# Patient Record
Sex: Female | Born: 1970 | Race: White | Hispanic: No | Marital: Married | Smoking: Never smoker
Health system: Southern US, Community
[De-identification: ages and names within clinical notes are randomized; demographics above are authoritative.]

## PROBLEM LIST (undated history)

## (undated) DIAGNOSIS — F419 Anxiety disorder, unspecified: Secondary | ICD-10-CM

## (undated) DIAGNOSIS — F32A Depression, unspecified: Secondary | ICD-10-CM

## (undated) DIAGNOSIS — K589 Irritable bowel syndrome without diarrhea: Secondary | ICD-10-CM

## (undated) DIAGNOSIS — F329 Major depressive disorder, single episode, unspecified: Secondary | ICD-10-CM

## (undated) HISTORY — DX: Irritable bowel syndrome, unspecified: K58.9

## (undated) HISTORY — PX: WISDOM TOOTH EXTRACTION: SHX21

## (undated) HISTORY — DX: Depression, unspecified: F32.A

## (undated) HISTORY — DX: Anxiety disorder, unspecified: F41.9

## (undated) HISTORY — DX: Major depressive disorder, single episode, unspecified: F32.9

---

## 2002-11-23 ENCOUNTER — Ambulatory Visit (HOSPITAL_COMMUNITY): Admission: RE | Admit: 2002-11-23 | Discharge: 2002-11-23 | Payer: Self-pay | Admitting: Obstetrics and Gynecology

## 2002-11-23 ENCOUNTER — Encounter: Payer: Self-pay | Admitting: Obstetrics and Gynecology

## 2003-02-09 ENCOUNTER — Ambulatory Visit (HOSPITAL_COMMUNITY): Admission: RE | Admit: 2003-02-09 | Discharge: 2003-02-09 | Payer: Self-pay | Admitting: Obstetrics and Gynecology

## 2003-02-09 ENCOUNTER — Encounter: Payer: Self-pay | Admitting: Obstetrics and Gynecology

## 2003-04-17 ENCOUNTER — Inpatient Hospital Stay (HOSPITAL_COMMUNITY): Admission: AD | Admit: 2003-04-17 | Discharge: 2003-04-20 | Payer: Self-pay | Admitting: Obstetrics and Gynecology

## 2004-08-29 ENCOUNTER — Other Ambulatory Visit: Admission: RE | Admit: 2004-08-29 | Discharge: 2004-08-29 | Payer: Self-pay | Admitting: Obstetrics and Gynecology

## 2005-02-06 ENCOUNTER — Inpatient Hospital Stay (HOSPITAL_COMMUNITY): Admission: RE | Admit: 2005-02-06 | Discharge: 2005-02-09 | Payer: Self-pay | Admitting: Obstetrics and Gynecology

## 2005-09-15 ENCOUNTER — Other Ambulatory Visit: Admission: RE | Admit: 2005-09-15 | Discharge: 2005-09-15 | Payer: Self-pay | Admitting: Obstetrics and Gynecology

## 2006-05-04 ENCOUNTER — Ambulatory Visit: Payer: Self-pay | Admitting: Internal Medicine

## 2006-05-20 ENCOUNTER — Ambulatory Visit: Payer: Self-pay | Admitting: Family Medicine

## 2006-06-17 ENCOUNTER — Ambulatory Visit: Payer: Self-pay | Admitting: Family Medicine

## 2006-06-17 LAB — CONVERTED CEMR LAB
Basophils Absolute: 0 10*3/uL (ref 0.0–0.1)
HDL: 66.4 mg/dL (ref >39.0)
Lymphocytes Relative: 41.6 % (ref 12.0–46.0)
MCV: 96.8 fL (ref 78.0–100.0)
Monocytes Relative: 10.6 % (ref 3.0–11.0)
Neutro Abs: 2.4 10*3/uL (ref 1.4–7.7)
Platelets: 366 10*3/uL (ref 150–400)
TSH: 1.59 microintl units/mL (ref 0.35–5.50)
Triglyceride fasting, serum: 85 mg/dL (ref 0–149)
VLDL: 17 mg/dL (ref 0–40)

## 2007-09-08 ENCOUNTER — Ambulatory Visit: Payer: Self-pay | Admitting: Family Medicine

## 2007-09-08 DIAGNOSIS — J019 Acute sinusitis, unspecified: Secondary | ICD-10-CM

## 2007-09-26 ENCOUNTER — Telehealth (INDEPENDENT_AMBULATORY_CARE_PROVIDER_SITE_OTHER): Payer: Self-pay | Admitting: *Deleted

## 2007-09-29 ENCOUNTER — Encounter (INDEPENDENT_AMBULATORY_CARE_PROVIDER_SITE_OTHER): Payer: Self-pay | Admitting: *Deleted

## 2007-10-05 ENCOUNTER — Telehealth (INDEPENDENT_AMBULATORY_CARE_PROVIDER_SITE_OTHER): Payer: Self-pay | Admitting: Family Medicine

## 2007-11-01 ENCOUNTER — Telehealth (INDEPENDENT_AMBULATORY_CARE_PROVIDER_SITE_OTHER): Payer: Self-pay | Admitting: *Deleted

## 2008-09-19 ENCOUNTER — Ambulatory Visit: Payer: Self-pay | Admitting: Family Medicine

## 2008-09-19 DIAGNOSIS — L719 Rosacea, unspecified: Secondary | ICD-10-CM | POA: Insufficient documentation

## 2009-08-19 ENCOUNTER — Ambulatory Visit: Payer: Self-pay | Admitting: Family

## 2009-08-19 DIAGNOSIS — H1030 Unspecified acute conjunctivitis, unspecified eye: Secondary | ICD-10-CM | POA: Insufficient documentation

## 2010-09-07 ENCOUNTER — Encounter: Payer: Self-pay | Admitting: Obstetrics and Gynecology

## 2010-09-07 ENCOUNTER — Encounter: Payer: Self-pay | Admitting: Family Medicine

## 2010-09-18 NOTE — Assessment & Plan Note (Signed)
Summary: infection in both eyes/kdc   Vital Signs:  Patient profile:   40 year old female Weight:      130.2 pounds Temp:     98.5 degrees F oral BP sitting:   110 / 60  (left arm)  Vitals Entered By: Doristine Devoid (August 19, 2009 11:24 AM) CC: both eyes itching and redness  Flu Vaccine Consent Questions     Do you have a history of severe allergic reactions to this vaccine? no    Any prior history of allergic reactions to egg and/or gelatin? no    Do you have a sensitivity to the preservative Thimersol? no    Do you have a past history of Guillan-Barre Syndrome? no    Do you currently have an acute febrile illness? no    Have you ever had a severe reaction to latex? no    Vaccine information given and explained to patient? yes    Are you currently pregnant? no    Lot Number:[id number]   Exp Date:02/13/2010   Site Given  Right Deltoid IM   CC:  both eyes itching and redness .  History of Present Illness: Ms Sonnenfeld is a 40 year old female who presents today with bilateral eye redness.  Started on saturday in the left eye, then progressed to include the right eye as well.    Notes + sick contacts- child had H1N1 recently.    Allergies: 1)  Codeine  Review of Systems       Notes + itching, green "goop" in both eyes in AM "sealed shut" in AM .  + clear nasal congestion, no cough,  low grade temp 99.5.  Denies visual changes  Physical Exam  General:  Well-developed,well-nourished,in no acute distress; alert,appropriate and cooperative throughout examination Head:  Normocephalic and atraumatic without obvious abnormalities. No apparent alopecia or balding. Eyes:  bilateral scleral injection Ears:  External ear exam shows no significant lesions or deformities.  Otoscopic examination reveals clear canals, tympanic membranes are intact bilaterally without bulging, retraction, inflammation or discharge. Hearing is grossly normal bilaterally. Lungs:  Normal respiratory effort,  chest expands symmetrically. Lungs are clear to auscultation, no crackles or wheezes. Heart:  Normal rate and regular rhythm. S1 and S2 normal without gallop, murmur, click, rub or other extra sounds.   Impression & Recommendations:  Problem # 1:  CONJUNCTIVITIS, ACUTE, BILATERAL (ICD-372.00) Assessment New Advised patient on good hand washing.  Patient given letter for work.  Her updated medication list for this problem includes:    Ciloxan 0.3 % Soln (Ciprofloxacin hcl) .Marland Kitchen... 2 drops in each eye every 2 hours while awake x 2 days then every 4 hours x 5 days.  Complete Medication List: 1)  Ciloxan 0.3 % Soln (Ciprofloxacin hcl) .... 2 drops in each eye every 2 hours while awake x 2 days then every 4 hours x 5 days.  Other Orders: Admin 1st Vaccine (57846) Flu Vaccine 25yrs + (96295)  Patient Instructions: 1)  call if your symptoms worsen or do not improve.  Go to ER if you develop visual problems.  Prescriptions: CILOXAN 0.3 % SOLN (CIPROFLOXACIN HCL) 2 drops in each eye every 2 hours while awake x 2 days then every 4 hours x 5 days.  #1 x 1   Entered and Authorized by:   Lemont Fillers FNP   Signed by:   Lemont Fillers FNP on 08/19/2009   Method used:   Electronically to  [facility] [address] [id number] (retail)       [address]       [place], [place]  [address]       Ph: [phone]       Fax: [phone]   RxID:   [id number]

## 2010-09-18 NOTE — Letter (Signed)
Summary: Out of Work  [facility] [employer]  [address] [place], [place] [address]   Phone: [phone]  Fax: [phone]    August 19, 2009   Employee:  CRISTIE MCKINNEY    To Whom It May Concern:   For Medical reasons, please excuse the above named employee from work for the following dates:  Start:   1/3  End:   may return 1/5  If you need additional information, please feel free to contact our office.         Sincerely,    Lemont Fillers FNP

## 2010-12-29 ENCOUNTER — Other Ambulatory Visit: Payer: Self-pay | Admitting: Obstetrics and Gynecology

## 2010-12-29 DIAGNOSIS — N6310 Unspecified lump in the right breast, unspecified quadrant: Secondary | ICD-10-CM

## 2011-01-02 ENCOUNTER — Ambulatory Visit
Admission: RE | Admit: 2011-01-02 | Discharge: 2011-01-02 | Disposition: A | Discharge status: Home / Self Care | Payer: BC Managed Care – PPO | Source: Ambulatory Visit | Attending: Obstetrics and Gynecology | Admitting: Obstetrics and Gynecology

## 2011-01-02 DIAGNOSIS — N6310 Unspecified lump in the right breast, unspecified quadrant: Secondary | ICD-10-CM

## 2011-01-02 NOTE — H&P (Signed)
NAMEAGNES, BRIGHTBILL NO.:  [id number]   MEDICAL RECORD NO.:  [id number]          PATIENT TYPE:  INP   LOCATION:  NA                            [facility]   PHYSICIAN:  Huel Cote, M.D. DATE OF BIRTH:  04/28/1971   DATE OF ADMISSION:  DATE OF DISCHARGE:                                HISTORY & PHYSICAL   HISTORY OF PRESENT ILLNESS:  Ms. Willig is a 40 year old G3, P1-0-1-1, who  is being admitted for a scheduled cesarean section at 46 weeks' gestation  for a breech presentation and oligohydramnios.  The patient's due date is  February 26, 2005, by her period consistent with a first trimester ultrasound at  nine weeks.  She was found to have an amniotic fluid level of 6.4 on June 19  and was followed up three days later after rest and hydration with a maximum  fluid level noted to be 5.5-6.  The baby's position was a breech position  and given the persistently low fluid and breech presentation, decision was  made to proceed with cesarean section after careful counseling.  Prenatal  care has been otherwise uneventful.   LABORATORY DATA:  Prenatal labs are as follows:  A positive, antibody  negative.  RPR nonreactive.  Rubella immune.  Hepatitis B surface antigen  negative.  HIV declined.  GC negative, Chlamydia negative.  Group B Strep  negative.  Triple screen normal.   PAST OBSTETRICAL HISTORY:  Significant for one vaginal delivery of a 6 pound  3 ounce infant at 39 weeks in September of 2004.  In 2005 she had one  spontaneous miscarriage.   PAST GYN HISTORY:  Unremarkable.   PAST MEDICAL HISTORY:  Significant for irritable bowel syndrome.   PAST SURGICAL HISTORY:  None.   HABITS:  She was a smoker; however, she quit with pregnancy.   PHYSICAL EXAMINATION:  VITAL SIGNS:  The patient's weight is 144 pounds,  blood pressure 100/70.  CARDIAC:  Regular rate and rhythm.  LUNGS:  Clear.  ABDOMEN:  Gravid, soft, and nontender.  CERVIX:  Cervix was 50/1  and breech presentation noted.   The patient was counseled to the risks and benefits of proceeding with  cesarean section including bleeding, infection, possible damage to bowel and  bladder.  She understands these risks and desires to proceed with the C-  section as stated.  We also discussed the possibility of some premature  symptoms in a 37-week infant; however, given her persistent AFI at around 5,  it was felt that given she is term status now, that it is best to proceed  with cesarean section.       KR/MEDQ  D:  02/05/2005  T:  02/05/2005  Job:  [id number]

## 2011-01-02 NOTE — Op Note (Signed)
Joyce Jackson, Joyce Jackson                ACCOUNT NO.:  [id number]   MEDICAL RECORD NO.:  [id number]          PATIENT TYPE:  INP   LOCATION:  9198                          [facility]   PHYSICIAN:  Huel Cote, M.D. DATE OF BIRTH:  03/25/1971   DATE OF PROCEDURE:  02/06/2005  DATE OF DISCHARGE:                                 OPERATIVE REPORT   PREOPERATIVE DIAGNOSES:  1.  Term pregnancy at 37 weeks.  2.  Breech presentation.  3.  Oligohydramnios.   POSTOPERATIVE DIAGNOSES:  1.  Term pregnancy at 37 weeks.  2.  Breech presentation.  3.  Oligohydramnios.   PROCEDURE:  Primary low transverse cesarean section.   SURGEON:  Dr. Huel Cote.   ASSISTANT:  Dr. Tracey Harries.   ANESTHESIA:  Spinal.   FLUIDS:  Estimated blood loss 800 mL, urine output 400 mL.   FINDINGS:  A vigorous female infant in the frank breech presentation.  Apgars were 9 and 9, the weight was 5 pounds 14 ounces.  There were normal  uterus, tubes and ovaries noted bilaterally.   OPERATIVE NOTE:  The patient was taken to the operating room, where spinal  anesthesia was obtained without difficulty.  She was then prepped and draped in the normal sterile fashion in the dorsal  supine position with a leftward tilt.  A Pfannenstiel skin incision was then  made and carried through to the underlying layer of fascia with sharp  dissection and Bovie cautery.  The fascia was then nicked in the midline.  The incision was extended laterally with Mayo scissors.  The inferior aspect  was then grasped Kocher clamps, elevated and dissected off the underlying  rectus muscles, superior was elevated and dissected off the rectus muscles.  The rectus muscles were separated in the midline and the peritoneal incision  was opened sharply.  Peritoneum was then extended both superiorly and  inferiorly with careful attention to avoid both bowel and bladder.  The  bladder blade was then inserted and the vesicouterine peritoneum  identified  and a bladder flap created with Metzenbaum scissors.  The uterus was then  incised in a transverse fashion and the cavity itself entered bluntly.  There was a small amount of clear fluid noted.  The bandage scissors were  then used to further open the uterine incision.  The infant was then  delivered from a breech presentation with arms and legs carefully reduced  and the head delivered in a flexed position without difficulty.  Nuchal cord  was reduced after delivery of head.  The infant was bulb-suctioned, the cord  clamped and the infant handed to the waiting pediatricians.  The cord blood  was obtained and the placenta delivered manually.  The uterus was cleared of  all clots and debris with a moist lap sponge.  The uterine incision was then  closed with 0 chromic in two layers, first a running locked layer and the  second an imbricating layer.  The incision then appeared hemostatic and the  rectus muscles were then closed with several interrupted sutures of 0  Vicryl.  The  fascia was closed with 0 Vicryl in a running fashion and the  skin was closed with staples.  The sponge, lap and needle counts were  correct x2 and the patient was taken to the recovery room in stable  condition.  Baby went to the newborn nursery.       KR/MEDQ  D:  02/06/2005  T:  02/06/2005  Job:  [id number]

## 2011-01-02 NOTE — Discharge Summary (Signed)
Joyce Jackson, Joyce Jackson                ACCOUNT NO.:  [id number]   MEDICAL RECORD NO.:  [id number]          PATIENT TYPE:  INP   LOCATION:  9102                          [facility]   PHYSICIAN:  Huel Cote, M.D. DATE OF BIRTH:  July 21, 1971   DATE OF ADMISSION:  02/06/2005  DATE OF DISCHARGE:  02/09/2005                                 DISCHARGE SUMMARY   DISCHARGE DIAGNOSES:  1.  Term pregnancy at 37 weeks, delivered.  2.  Breech presentation.  3.  Oligohydramnios.  4.  Status post primary low transverse cesarean section.   DISCHARGE MEDICATIONS:  1.  Motrin 600 mg p.o. q.6h.  2.  Percocet one to two tablets p.o. q.4h. p.r.n.   DISCHARGE FOLLOWUP:  The patient is to follow up in 2 weeks for an incision  check.   HOSPITAL COURSE:  The patient is a 40 year old G2 P1 who is admitted for a  scheduled cesarean section at 37+ weeks for a breech presentation and  oligohydramnios. The patient's fluid had been followed closely over the last  2 weeks of pregnancy and had drifted down to approximately 5 AFI. The baby  had a breech presentation and persistently low fluid which made a version  attempt impossible. Other prenatal care had been uneventful. For entire  medical history please see the previously dictated H&P. On the day of  surgery the patient underwent a primary low transverse cesarean section  without difficulty. She was delivered of a vigorous female infant in the  frank breech presentation. Apgars were 9 and 9, weight was 5 pounds 14  ounces. She was noted to have normal uterus, ovaries and tubes. She was then  admitted for routine postpartum and postoperative care and did very well. On  postoperative day #1 she was afebrile with stable vital signs and was doing  quite well ambulating. The baby did have a brief stay in the NICU for some  transient tachypnea and low blood sugar; however, was able to be discharged  home at the normal time. The patient also did quite well.  Her postoperative  hemoglobin was 10.5, down from 13.1. By postoperative day #3 she was  tolerating regular diet, ambulating, and her pain was well controlled. Her  incision was clear and staples were out, Steri-Strips placed. She was then  felt stable for discharge home and as baby was able to be discharged, was  discharged home with follow-up in the office as previously stated.      Huel Cote, M.D.  Electronically Signed     KR/MEDQ  D:  03/31/2005  T:  03/31/2005  Job:  [id number]

## 2011-01-02 NOTE — Discharge Summary (Signed)
NAME:  Joyce Jackson, Joyce Jackson                          ACCOUNT NO.:  [id number]   MEDICAL RECORD NO.:  [id number]                   PATIENT TYPE:  INP   LOCATION:  9103                                 [facility]   PHYSICIAN:  Huel Cote, M.D.              DATE OF BIRTH:  [birth year]   DATE OF ADMISSION:  04/17/2003  DATE OF DISCHARGE:  04/20/2003                                 DISCHARGE SUMMARY   DISCHARGE DIAGNOSES:  1. Term pregnancy at 39-weeks delivered.  2. Status post normal spontaneous vaginal delivery.   DISCHARGE MEDICATIONS:  1. Motrin 600 mg p.o. every six hours.  2. Percocet 1-2 tablets p.o. every four hours p.r.n.   DISCHARGE FOLLOWUP:  Patient is to followup in the office for her routine  visit in six weeks.   HOSPITAL COURSE:  Patient is a 40 year old G1, P0 who was admitted at 39-0/7  weeks with a complaint of spontaneous rupture of membranes.  She only had  mild irregular contractions on presentation.  Prenatal care was  uncomplicated.  Prenatal labs were as follows:  A positive, antibody  negative, RPR nonreactive, rubella immune, hepatitis B surface antigen  negative, HIV negative, GC negative, Chlamydia negative, GBS negative.   PAST OB HISTORY:  None.   PAST GYN HISTORY:  No abnormal Pap smears.   PAST MEDICAL HISTORY:  1. History of irritable bowel syndrome.  2. History of Raynaud disease.   PAST SURGICAL HISTORY:  None.   ALLERGIES:  None.   PHYSICAL EXAMINATION:  VITAL SIGNS:  Afebrile with stable vital signs.  Fetal heart rate was reactive.  PELVIC:  Only occasional irregular contractions were noted.  The patient was  gravid and nontender.  Estimated fetal weight was 7 and 1/2 pounds.  Cervix  was 90%, 3-cm, and -2 station with a bulging fore bag, positive nitrazine,  positive pool.   She was admitted for delivery.  The patient decided to walk primarily for  several hours and progressed on her own to 5-cm.  At that point she received  an  epidural.  She continued to progress throughout the night and reached  complete dilation and pushed well with a normal spontaneous vaginal delivery  of a vigorous female infant over a first-degree vaginal laceration.  Apgar's  were 8 and 9.  Weight was 6 pounds 3 ounces.  Placenta was expressed  spontaneously.  Estimated blood loss was 300 cc.  Small first-degree  laceration was repaired with 2-0 Vicryl for hemostasis.   By postpartum day number two, she was doing very well.  Had no complaints.  Her pain was well controlled and she was felt stable for discharge home.  The fundus was firm.  She was afebrile and she was discharged home with  Motrin and Percocet prescriptions.  Huel Cote, M.D.    KR/MEDQ  D:  04/20/2003  T:  04/21/2003  Job:  [id number]

## 2012-05-07 LAB — HM PAP SMEAR: HM PAP: NORMAL

## 2012-10-14 ENCOUNTER — Other Ambulatory Visit: Payer: Self-pay

## 2012-10-14 DIAGNOSIS — Z1231 Encounter for screening mammogram for malignant neoplasm of breast: Secondary | ICD-10-CM

## 2012-11-09 ENCOUNTER — Ambulatory Visit
Admission: RE | Admit: 2012-11-09 | Discharge: 2012-11-09 | Disposition: A | Discharge status: Home / Self Care | Payer: 59 | Source: Ambulatory Visit

## 2012-11-09 DIAGNOSIS — Z1231 Encounter for screening mammogram for malignant neoplasm of breast: Secondary | ICD-10-CM

## 2013-04-13 ENCOUNTER — Ambulatory Visit (INDEPENDENT_AMBULATORY_CARE_PROVIDER_SITE_OTHER): Payer: 59 | Admitting: Family Medicine

## 2013-04-13 ENCOUNTER — Encounter: Payer: Self-pay | Admitting: Family Medicine

## 2013-04-13 VITALS — BP 120/78 | HR 83 | Temp 99.1°F | Ht 63.25 in | Wt 127.8 lb

## 2013-04-13 DIAGNOSIS — Z0000 Encounter for general adult medical examination without abnormal findings: Secondary | ICD-10-CM

## 2013-04-13 DIAGNOSIS — F4321 Adjustment disorder with depressed mood: Secondary | ICD-10-CM

## 2013-04-13 DIAGNOSIS — F329 Major depressive disorder, single episode, unspecified: Secondary | ICD-10-CM | POA: Insufficient documentation

## 2013-04-13 MED ORDER — ESCITALOPRAM OXALATE 10 MG PO TABS: 10.0000 mg | ORAL_TABLET | Freq: Every day | ORAL | Status: DC

## 2013-04-13 NOTE — Patient Instructions (Addendum)
Follow up in 4-6 weeks to recheck mood Start the Lexapro daily Continue to exercise, meditate and use other stress outlets Treat the plantars wart w/ Compound W and duct tape We'll notify you of your lab results and make any changes if needed Call with any questions or concerns Hang in there!!!

## 2013-04-13 NOTE — Progress Notes (Signed)
  Subjective:    Patient ID: Joyce Jackson, female    DOB: Mar 09, 1971, 42 y.o.   MRN: [id number]  HPI Pt here to re-establish care.  Has not been seen recently.  UTD on GYN.  Increased stress- 'not a big pill taker'.  Father is now living in pt's home.  He is lifelong alcoholic and pt's relationship w/ dad is difficult.  Dad got lost while driving and ended up in [facility], was admitted to [facility] for 1 week and d/c'd into pt's care.  Pt is tearful.   Review of Systems Patient reports no vision/ hearing changes, adenopathy,fever, weight change,  persistant/recurrent hoarseness , swallowing issues, chest pain, palpitations, edema, persistant/recurrent cough, hemoptysis, dyspnea (rest/exertional/paroxysmal nocturnal), gastrointestinal bleeding (melena, rectal bleeding), abdominal pain, significant heartburn, bowel changes, GU symptoms (dysuria, hematuria, incontinence), Gyn symptoms (abnormal  bleeding, pain),  syncope, focal weakness, memory loss, numbness & tingling, skin/hair/nail changes, abnormal bruising or bleeding.     Objective:   Physical Exam General Appearance:    Alert, cooperative, no distress, appears stated age  Head:    Normocephalic, without obvious abnormality, atraumatic  Eyes:    PERRL, conjunctiva/corneas clear, EOM's intact, fundi    benign, both eyes  Ears:    Normal TM's and external ear canals, both ears  Nose:   Nares normal, septum midline, mucosa normal, no drainage    or sinus tenderness  Throat:   Lips, mucosa, and tongue normal; teeth and gums normal  Neck:   Supple, symmetrical, trachea midline, no adenopathy;    Thyroid: no enlargement/tenderness/nodules  Back:     Symmetric, no curvature, ROM normal, no CVA tenderness  Lungs:     Clear to auscultation bilaterally, respirations unlabored  Chest Wall:    No tenderness or deformity   Heart:    Regular rate and rhythm, S1 and S2 normal, no murmur, rub   or gallop  Breast Exam:    Deferred to GYN  Abdomen:      Soft, non-tender, bowel sounds active all four quadrants,    no masses, no organomegaly  Genitalia:    Deferred to GYN  Rectal:    Extremities:   Extremities normal, atraumatic, no cyanosis or edema  Pulses:   2+ and symmetric all extremities  Skin:   Skin color, texture, turgor normal, no rashes or lesions.  + plantar wart on L foot  Lymph nodes:   Cervical, supraclavicular, and axillary nodes normal  Neurologic:   CNII-XII intact, normal strength, sensation and reflexes    throughout          Assessment & Plan:

## 2013-04-17 NOTE — Assessment & Plan Note (Addendum)
New.  Pt is in difficult situation.  Already strained relationship w/ father and now he is living w/ her as a recovering alcoholic.  Start low dose SSRI.  Will follow closely.

## 2013-04-17 NOTE — Assessment & Plan Note (Signed)
Pt's PE WNL.  UTD on GYN.  Check labs.  Anticipatory guidance provided.  

## 2013-05-07 LAB — HM MAMMOGRAPHY: HM Mammogram: NORMAL

## 2013-05-18 ENCOUNTER — Ambulatory Visit: Payer: 59 | Admitting: Family Medicine

## 2013-05-18 DIAGNOSIS — Z0289 Encounter for other administrative examinations: Secondary | ICD-10-CM

## 2013-11-28 ENCOUNTER — Encounter: Payer: Self-pay | Admitting: General Practice

## 2013-11-28 NOTE — Progress Notes (Signed)
Letter mailed to pt to notify that labs were not performed at last visit as ordered. Pt advised to contact office to schedule appt.  

## 2014-05-07 ENCOUNTER — Ambulatory Visit (INDEPENDENT_AMBULATORY_CARE_PROVIDER_SITE_OTHER): Payer: 59 | Admitting: Family Medicine

## 2014-05-07 ENCOUNTER — Encounter: Payer: Self-pay | Admitting: Family Medicine

## 2014-05-07 VITALS — BP 124/84 | HR 84 | Temp 98.0°F | Resp 16 | Ht 64.25 in | Wt 129.1 lb

## 2014-05-07 DIAGNOSIS — Z23 Encounter for immunization: Secondary | ICD-10-CM

## 2014-05-07 DIAGNOSIS — Z0000 Encounter for general adult medical examination without abnormal findings: Secondary | ICD-10-CM

## 2014-05-07 LAB — LIPID PANEL
CHOLESTEROL: 151 mg/dL (ref 0–200)
HDL: 55.7 mg/dL (ref >39.00)
LDL Cholesterol: 73 mg/dL (ref 0–99)
NonHDL: 95.3
TRIGLYCERIDES: 110 mg/dL (ref 0.0–149.0)
Total CHOL/HDL Ratio: 3
VLDL: 22 mg/dL (ref 0.0–40.0)

## 2014-05-07 LAB — CBC WITH DIFFERENTIAL/PLATELET
BASOS ABS: 0 10*3/uL (ref 0.0–0.1)
Basophils Relative: 0.5 % (ref 0.0–3.0)
EOS ABS: 0.1 10*3/uL (ref 0.0–0.7)
Eosinophils Relative: 1.6 % (ref 0.0–5.0)
HCT: 35.5 % — ABNORMAL LOW (ref 36.0–46.0)
Hemoglobin: 11.6 g/dL — ABNORMAL LOW (ref 12.0–15.0)
LYMPHS PCT: 28.6 % (ref 12.0–46.0)
Lymphs Abs: 1.8 10*3/uL (ref 0.7–4.0)
MCHC: 32.8 g/dL (ref 30.0–36.0)
MCV: 90.5 fl (ref 78.0–100.0)
MONOS PCT: 8.8 % (ref 3.0–12.0)
Monocytes Absolute: 0.5 10*3/uL (ref 0.1–1.0)
Neutro Abs: 3.7 10*3/uL (ref 1.4–7.7)
Neutrophils Relative %: 60.5 % (ref 43.0–77.0)
PLATELETS: 400 10*3/uL (ref 150.0–400.0)
RBC: 3.93 Mil/uL (ref 3.87–5.11)
RDW: 14.2 % (ref 11.5–15.5)
WBC: 6.2 10*3/uL (ref 4.0–10.5)

## 2014-05-07 LAB — BASIC METABOLIC PANEL
BUN: 11 mg/dL (ref 6–23)
CALCIUM: 8.8 mg/dL (ref 8.4–10.5)
CO2: 27 meq/L (ref 19–32)
Chloride: 105 mEq/L (ref 96–112)
Creatinine, Ser: 0.8 mg/dL (ref 0.4–1.2)
GFR: 85.2 mL/min (ref >60.00)
GLUCOSE: 81 mg/dL (ref 70–99)
POTASSIUM: 3.6 meq/L (ref 3.5–5.1)
SODIUM: 140 meq/L (ref 135–145)

## 2014-05-07 LAB — HEPATIC FUNCTION PANEL
ALBUMIN: 3.8 g/dL (ref 3.5–5.2)
ALK PHOS: 31 U/L — AB (ref 39–117)
ALT: 30 U/L (ref 0–35)
AST: 25 U/L (ref 0–37)
Bilirubin, Direct: 0 mg/dL (ref 0.0–0.3)
TOTAL PROTEIN: 6.7 g/dL (ref 6.0–8.3)
Total Bilirubin: 0.1 mg/dL — ABNORMAL LOW (ref 0.2–1.2)

## 2014-05-07 LAB — TSH: TSH: 0.82 u[IU]/mL (ref 0.35–4.50)

## 2014-05-07 LAB — VITAMIN D 25 HYDROXY (VIT D DEFICIENCY, FRACTURES): VITD: 42.87 ng/mL (ref 30.00–100.00)

## 2014-05-07 NOTE — Assessment & Plan Note (Signed)
Pt's PE WNL.  UTD on pap, mammo.  Check labs.  Anticipatory guidance provided.  

## 2014-05-07 NOTE — Progress Notes (Signed)
Pre visit review using our clinic review tool, if applicable. No additional management support is needed unless otherwise documented below in the visit note. 

## 2014-05-07 NOTE — Progress Notes (Signed)
   Subjective:    Patient ID: Joyce Jackson, female    DOB: March 21, 1971, 43 y.o.   MRN: [id number]  HPI CPE- UTD on GYN, plans to schedule mammo.   Review of Systems Patient reports no vision/ hearing changes, adenopathy,fever, weight change,  persistant/recurrent hoarseness , swallowing issues, chest pain, palpitations, edema, persistant/recurrent cough, hemoptysis, dyspnea (rest/exertional/paroxysmal nocturnal), gastrointestinal bleeding (melena, rectal bleeding), abdominal pain, significant heartburn, bowel changes, GU symptoms (dysuria, hematuria, incontinence), Gyn symptoms (abnormal  bleeding, pain),  syncope, focal weakness, memory loss, numbness & tingling, skin/hair/nail changes, abnormal bruising or bleeding, anxiety, or depression.     Objective:   Physical Exam General Appearance:    Alert, cooperative, no distress, appears stated age  Head:    Normocephalic, without obvious abnormality, atraumatic  Eyes:    PERRL, conjunctiva/corneas clear, EOM's intact, fundi    benign, both eyes  Ears:    Normal TM's and external ear canals, both ears  Nose:   Nares normal, septum midline, mucosa normal, no drainage    or sinus tenderness  Throat:   Lips, mucosa, and tongue normal; teeth and gums normal  Neck:   Supple, symmetrical, trachea midline, no adenopathy;    Thyroid: no enlargement/tenderness/nodules  Back:     Symmetric, no curvature, ROM normal, no CVA tenderness  Lungs:     Clear to auscultation bilaterally, respirations unlabored  Chest Wall:    No tenderness or deformity   Heart:    Regular rate and rhythm, S1 and S2 normal, no murmur, rub   or gallop  Breast Exam:    Deferred to GYN  Abdomen:     Soft, non-tender, bowel sounds active all four quadrants,    no masses, no organomegaly  Genitalia:    Deferred to GYN  Rectal:    Extremities:   Extremities normal, atraumatic, no cyanosis or edema  Pulses:   2+ and symmetric all extremities  Skin:   Skin color, texture,  turgor normal, no rashes or lesions  Lymph nodes:   Cervical, supraclavicular, and axillary nodes normal  Neurologic:   CNII-XII intact, normal strength, sensation and reflexes    throughout          Assessment & Plan:

## 2014-05-07 NOTE — Patient Instructions (Signed)
Follow up in 1 year or as needed We'll notify you of your lab results and make any changes if needed Keep up the good work!  You look great! Try and wean the tea to every other night Call with any questions or concerns Hang in there!  You're doing great!!!

## 2014-05-08 ENCOUNTER — Encounter: Payer: Self-pay | Admitting: General Practice

## 2015-03-06 ENCOUNTER — Encounter: Payer: Self-pay | Admitting: Family Medicine

## 2015-03-06 ENCOUNTER — Ambulatory Visit (INDEPENDENT_AMBULATORY_CARE_PROVIDER_SITE_OTHER): Payer: 59 | Admitting: Family Medicine

## 2015-03-06 VITALS — BP 110/76 | HR 70 | Temp 98.3°F | Resp 16 | Wt 138.5 lb

## 2015-03-06 DIAGNOSIS — F329 Major depressive disorder, single episode, unspecified: Secondary | ICD-10-CM | POA: Diagnosis not present

## 2015-03-06 DIAGNOSIS — F32A Depression, unspecified: Secondary | ICD-10-CM

## 2015-03-06 MED ORDER — BUPROPION HCL ER (XL) 150 MG PO TB24: 150.0000 mg | ORAL_TABLET | Freq: Every day | ORAL | Status: DC

## 2015-03-06 NOTE — Progress Notes (Signed)
   Subjective:    Patient ID: Joyce Jackson, female    DOB: 09-Dec-1970, 44 y.o.   MRN: [id number]  HPI Depression- a few years ago pt took Lexapro when dad was sick.  'i hate it'.  Constipation, abd discomfort, no sex drive.  Medication was effective in 'making me neutral' but 'i just cant deal with everything else'.  'i've been really really down, i'm just depressed'.  Low motivation, not exercising, weight gain.  Struggling w/ death of father and home stressors.  Denies SI/HI.  Friend is on Wellbutrin and told her to ask about this.   Review of Systems For ROS see HPI     Objective:   Physical Exam  Constitutional: She is oriented to person, place, and time. She appears well-developed and well-nourished. No distress.  HENT:  Head: Normocephalic and atraumatic.  Eyes: Conjunctivae and EOM are normal. Pupils are equal, round, and reactive to light.  Neck: Normal range of motion. Neck supple. No thyromegaly present.  Cardiovascular: Normal rate, regular rhythm and normal heart sounds.   Pulmonary/Chest: Effort normal and breath sounds normal. No respiratory distress. She has no wheezes. She has no rales.  Lymphadenopathy:    She has no cervical adenopathy.  Neurological: She is alert and oriented to person, place, and time.  Skin: Skin is warm and dry.  Psychiatric: Her behavior is normal. Thought content normal.  Anxious, rapid speech  Vitals reviewed.         Assessment & Plan:

## 2015-03-06 NOTE — Progress Notes (Signed)
Pre visit review using our clinic review tool, if applicable. No additional management support is needed unless otherwise documented below in the visit note. 

## 2015-03-06 NOTE — Patient Instructions (Signed)
Follow up in 3-4 weeks to recheck mood Start the Wellbutrin daily- take it in the morning with food Continue to work on a stress outlet- exercise is a great option! Call with any questions or concerns You can do this!!!

## 2015-03-10 NOTE — Assessment & Plan Note (Signed)
Deteriorated.  Pt was previously on Lexapro and felt mood improved but had side effects that she 'just couldn't deal with'.  Based on the decreased sex drive, lack of motivation, low energy will start Wellbutrin.  Discussed possible side effects and need for pt to closely monitor.  Pt expressed understanding and is in agreement w/ plan.

## 2015-03-29 ENCOUNTER — Ambulatory Visit: Payer: 59 | Admitting: Family Medicine

## 2015-05-13 ENCOUNTER — Encounter: Payer: 59 | Admitting: Family Medicine

## 2015-05-15 ENCOUNTER — Encounter: Payer: Self-pay | Admitting: Family Medicine

## 2015-05-15 ENCOUNTER — Ambulatory Visit (INDEPENDENT_AMBULATORY_CARE_PROVIDER_SITE_OTHER): Payer: 59 | Admitting: Family Medicine

## 2015-05-15 VITALS — BP 130/88 | HR 85 | Temp 98.5°F | Resp 16 | Ht 63.5 in | Wt 141.0 lb

## 2015-05-15 DIAGNOSIS — Z1231 Encounter for screening mammogram for malignant neoplasm of breast: Secondary | ICD-10-CM | POA: Diagnosis not present

## 2015-05-15 DIAGNOSIS — Z0000 Encounter for general adult medical examination without abnormal findings: Secondary | ICD-10-CM

## 2015-05-15 DIAGNOSIS — Z23 Encounter for immunization: Secondary | ICD-10-CM | POA: Diagnosis not present

## 2015-05-15 NOTE — Patient Instructions (Signed)
Follow up in 1 year or as needed Schedule a nurse visit for your tetanus shot We'll notify you of your lab results and make any changes if needed Keep up the good work on healthy diet and regular exercise- you look great! Call and schedule your appt your Dr Marvel Plan I put the order in for the mammogram at the [facility] Call with any questions or concerns If you want to join [place] at the new [place] office, any scheduled appointments will automatically transfer and we will see you at [address] [place] [address] [place], [place] [address] Happy Fall!!!

## 2015-05-15 NOTE — Progress Notes (Signed)
   Subjective:    Patient ID: Joyce Jackson, female    DOB: April 09, 1971, 44 y.o.   MRN: [id number]  HPI CPE- due for GYN appt (pt plans to schedule w/ Dr Marvel Plan), due for mammo (order entered).  Due for Flu.  No record of Tdap.      Review of Systems Patient reports no vision/ hearing changes, adenopathy,fever, weight change,  persistant/recurrent hoarseness , swallowing issues, chest pain, palpitations, edema, persistant/recurrent cough, hemoptysis, dyspnea (rest/exertional/paroxysmal nocturnal), gastrointestinal bleeding (melena, rectal bleeding), abdominal pain, significant heartburn, bowel changes, GU symptoms (dysuria, hematuria, incontinence), Gyn symptoms (abnormal  bleeding, pain),  syncope, focal weakness, memory loss, numbness & tingling, skin/hair/nail changes, abnormal bruising or bleeding, anxiety, or depression.     Objective:   Physical Exam General Appearance:    Alert, cooperative, no distress, appears stated age  Head:    Normocephalic, without obvious abnormality, atraumatic  Eyes:    PERRL, conjunctiva/corneas clear, EOM's intact, fundi    benign, both eyes  Ears:    Normal TM's and external ear canals, both ears  Nose:   Nares normal, septum midline, mucosa normal, no drainage    or sinus tenderness  Throat:   Lips, mucosa, and tongue normal; teeth and gums normal  Neck:   Supple, symmetrical, trachea midline, no adenopathy;    Thyroid: no enlargement/tenderness/nodules  Back:     Symmetric, no curvature, ROM normal, no CVA tenderness  Lungs:     Clear to auscultation bilaterally, respirations unlabored  Chest Wall:    No tenderness or deformity   Heart:    Regular rate and rhythm, S1 and S2 normal, no murmur, rub   or gallop  Breast Exam:    Deferred to GYN  Abdomen:     Soft, non-tender, bowel sounds active all four quadrants,    no masses, no organomegaly  Genitalia:    Deferred to GYN  Rectal:    Extremities:   Extremities normal, atraumatic, no cyanosis  or edema  Pulses:   2+ and symmetric all extremities  Skin:   Skin color, texture, turgor normal, no rashes or lesions  Lymph nodes:   Cervical, supraclavicular, and axillary nodes normal  Neurologic:   CNII-XII intact, normal strength, sensation and reflexes    throughout          Assessment & Plan:

## 2015-05-15 NOTE — Progress Notes (Signed)
Pre visit review using our clinic review tool, if applicable. No additional management support is needed unless otherwise documented below in the visit note. 

## 2015-05-16 LAB — CBC WITH DIFFERENTIAL/PLATELET
BASOS ABS: 0 10*3/uL (ref 0.0–0.1)
BASOS PCT: 0.6 % (ref 0.0–3.0)
EOS ABS: 0.1 10*3/uL (ref 0.0–0.7)
Eosinophils Relative: 1.4 % (ref 0.0–5.0)
HCT: 38.1 % (ref 36.0–46.0)
HEMOGLOBIN: 12.7 g/dL (ref 12.0–15.0)
LYMPHS PCT: 18.6 % (ref 12.0–46.0)
Lymphs Abs: 1.5 10*3/uL (ref 0.7–4.0)
MCHC: 33.3 g/dL (ref 30.0–36.0)
MCV: 98.8 fl (ref 78.0–100.0)
MONO ABS: 0.6 10*3/uL (ref 0.1–1.0)
Monocytes Relative: 7.5 % (ref 3.0–12.0)
Neutro Abs: 5.6 10*3/uL (ref 1.4–7.7)
Neutrophils Relative %: 71.9 % (ref 43.0–77.0)
Platelets: 356 10*3/uL (ref 150.0–400.0)
RBC: 3.86 Mil/uL — AB (ref 3.87–5.11)
RDW: 13 % (ref 11.5–15.5)
WBC: 7.8 10*3/uL (ref 4.0–10.5)

## 2015-05-16 LAB — HEPATIC FUNCTION PANEL
ALBUMIN: 4.4 g/dL (ref 3.5–5.2)
ALT: 14 U/L (ref 0–35)
AST: 18 U/L (ref 0–37)
Alkaline Phosphatase: 37 U/L — ABNORMAL LOW (ref 39–117)
Bilirubin, Direct: 0.1 mg/dL (ref 0.0–0.3)
TOTAL PROTEIN: 7 g/dL (ref 6.0–8.3)
Total Bilirubin: 0.5 mg/dL (ref 0.2–1.2)

## 2015-05-16 LAB — BASIC METABOLIC PANEL
BUN: 10 mg/dL (ref 6–23)
CO2: 30 mEq/L (ref 19–32)
Calcium: 9.6 mg/dL (ref 8.4–10.5)
Chloride: 101 mEq/L (ref 96–112)
Creatinine, Ser: 0.81 mg/dL (ref 0.40–1.20)
GFR: 81.19 mL/min (ref >60.00)
GLUCOSE: 84 mg/dL (ref 70–99)
POTASSIUM: 3.7 meq/L (ref 3.5–5.1)
Sodium: 139 mEq/L (ref 135–145)

## 2015-05-16 LAB — TSH: TSH: 1.86 u[IU]/mL (ref 0.35–4.50)

## 2015-05-16 LAB — LIPID PANEL
CHOL/HDL RATIO: 2
CHOLESTEROL: 201 mg/dL — AB (ref 0–200)
HDL: 105.3 mg/dL (ref >39.00)
LDL Cholesterol: 87 mg/dL (ref 0–99)
NonHDL: 95.51
TRIGLYCERIDES: 41 mg/dL (ref 0.0–149.0)
VLDL: 8.2 mg/dL (ref 0.0–40.0)

## 2015-05-16 LAB — VITAMIN D 25 HYDROXY (VIT D DEFICIENCY, FRACTURES): VITD: 36.48 ng/mL (ref 30.00–100.00)

## 2015-05-16 NOTE — Assessment & Plan Note (Signed)
Pt's PE WNL.  Due for mammo- order entered.  Due for GYN- pt plans to schedule.  Check labs.  Flu shot given.  Anticipatory guidance provided.

## 2015-05-17 NOTE — Addendum Note (Signed)
Addended by: Kelle Darting A on: 05/17/2015 09:11 AM   Modules accepted: Orders

## 2015-05-22 ENCOUNTER — Ambulatory Visit: Payer: 59

## 2015-05-24 ENCOUNTER — Telehealth: Payer: Self-pay | Admitting: Family Medicine

## 2015-05-24 NOTE — Telephone Encounter (Signed)
Spouse emailed proof of physical form, form forwarded to [employer].

## 2015-05-27 NOTE — Telephone Encounter (Signed)
Form forwarded to Dr. Birdie Riddle for signature. JG//CMA

## 2015-05-29 ENCOUNTER — Ambulatory Visit: Payer: 59

## 2015-05-30 NOTE — Telephone Encounter (Signed)
Signed form mailed to pt's home address. Copy sent for scanning. JG//CMA

## 2015-10-21 ENCOUNTER — Other Ambulatory Visit: Payer: Self-pay | Admitting: Family Medicine

## 2015-10-21 DIAGNOSIS — Z1231 Encounter for screening mammogram for malignant neoplasm of breast: Secondary | ICD-10-CM

## 2015-11-05 ENCOUNTER — Ambulatory Visit
Admission: RE | Admit: 2015-11-05 | Discharge: 2015-11-05 | Disposition: A | Discharge status: Home / Self Care | Payer: 59 | Source: Ambulatory Visit | Attending: Family Medicine | Admitting: Family Medicine

## 2015-11-05 DIAGNOSIS — Z1231 Encounter for screening mammogram for malignant neoplasm of breast: Secondary | ICD-10-CM

## 2016-05-20 ENCOUNTER — Encounter: Payer: 59 | Admitting: Family Medicine

## 2016-11-06 ENCOUNTER — Other Ambulatory Visit: Payer: Self-pay | Admitting: Family Medicine

## 2016-11-06 DIAGNOSIS — Z1231 Encounter for screening mammogram for malignant neoplasm of breast: Secondary | ICD-10-CM

## 2016-11-26 ENCOUNTER — Ambulatory Visit
Admission: RE | Admit: 2016-11-26 | Discharge: 2016-11-26 | Disposition: A | Discharge status: Home / Self Care | Payer: 59 | Source: Ambulatory Visit | Attending: Family Medicine | Admitting: Family Medicine

## 2016-11-26 DIAGNOSIS — Z1231 Encounter for screening mammogram for malignant neoplasm of breast: Secondary | ICD-10-CM

## 2018-01-04 ENCOUNTER — Encounter: Payer: Self-pay | Admitting: General Practice

## 2018-01-27 ENCOUNTER — Other Ambulatory Visit: Payer: Self-pay | Admitting: Family Medicine

## 2018-01-27 DIAGNOSIS — Z1231 Encounter for screening mammogram for malignant neoplasm of breast: Secondary | ICD-10-CM

## 2018-02-23 ENCOUNTER — Ambulatory Visit
Admission: RE | Admit: 2018-02-23 | Discharge: 2018-02-23 | Disposition: A | Discharge status: Home / Self Care | Payer: 59 | Source: Ambulatory Visit | Attending: Family Medicine | Admitting: Family Medicine

## 2018-02-23 DIAGNOSIS — Z1231 Encounter for screening mammogram for malignant neoplasm of breast: Secondary | ICD-10-CM

## 2019-01-23 ENCOUNTER — Other Ambulatory Visit: Payer: Self-pay | Admitting: Family Medicine

## 2019-01-23 DIAGNOSIS — Z1231 Encounter for screening mammogram for malignant neoplasm of breast: Secondary | ICD-10-CM

## 2019-03-13 ENCOUNTER — Other Ambulatory Visit: Payer: Self-pay

## 2019-03-13 ENCOUNTER — Ambulatory Visit
Admission: RE | Admit: 2019-03-13 | Discharge: 2019-03-13 | Disposition: A | Discharge status: Home / Self Care | Payer: 59 | Source: Ambulatory Visit | Attending: Family Medicine | Admitting: Family Medicine

## 2019-03-13 DIAGNOSIS — Z1231 Encounter for screening mammogram for malignant neoplasm of breast: Secondary | ICD-10-CM

## 2019-10-19 ENCOUNTER — Ambulatory Visit: Payer: Self-pay

## 2020-05-02 IMAGING — MG DIGITAL SCREENING BILATERAL MAMMOGRAM WITH TOMO AND CAD
6 of 10 series · 6 of 30 positions shown · non-contrast
Comparison: Previous exam(s).

CLINICAL DATA: Screening.

EXAM:
DIGITAL SCREENING BILATERAL MAMMOGRAM WITH TOMO AND CAD

[R CC synth-2D]
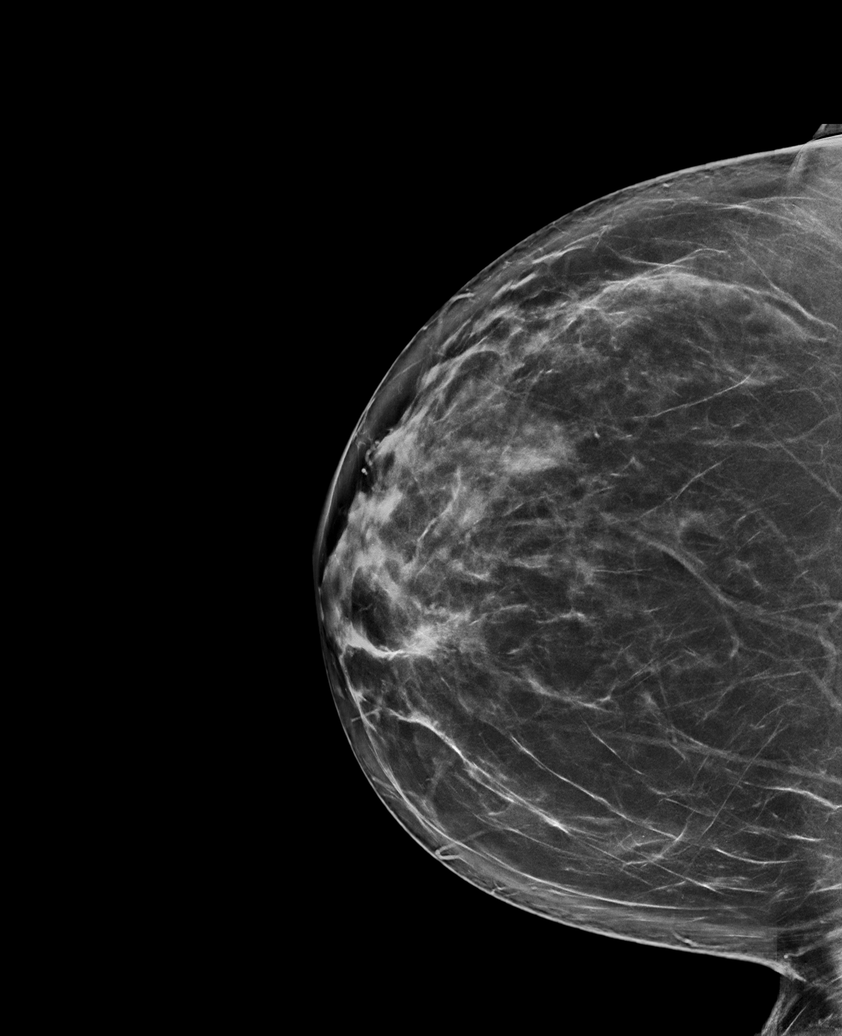

[L CC synth-2D (1 of 2)]
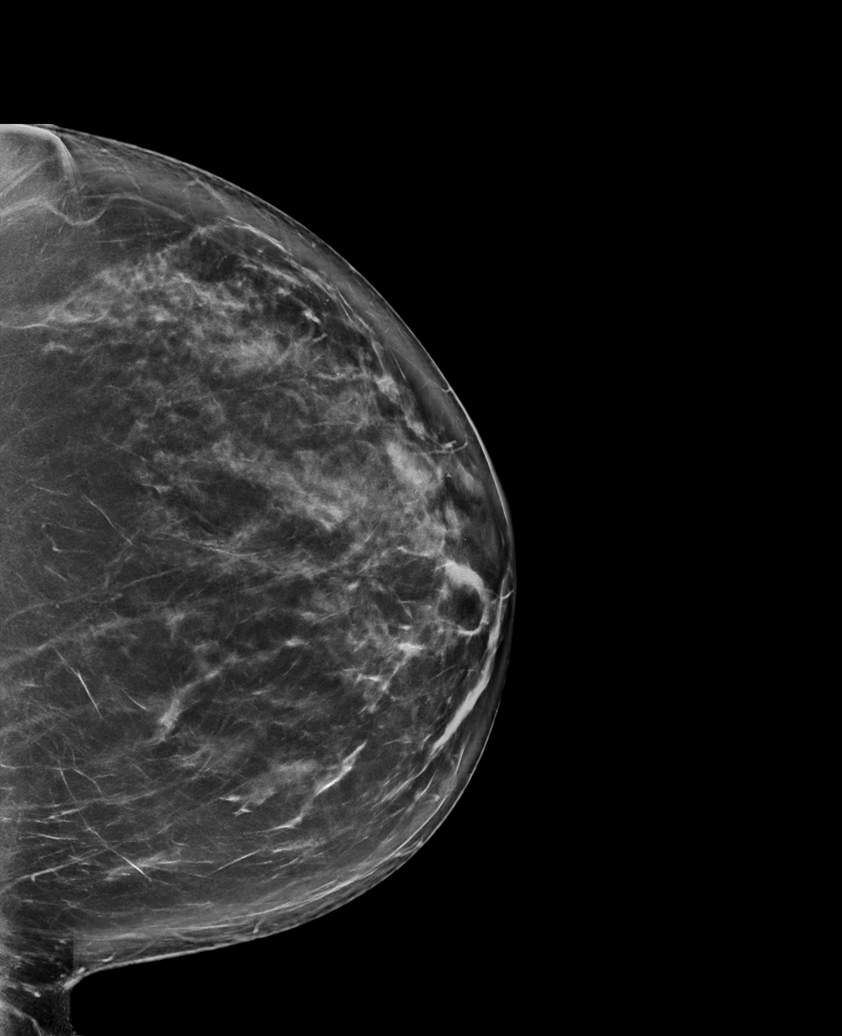

[L MLO synth-2D]
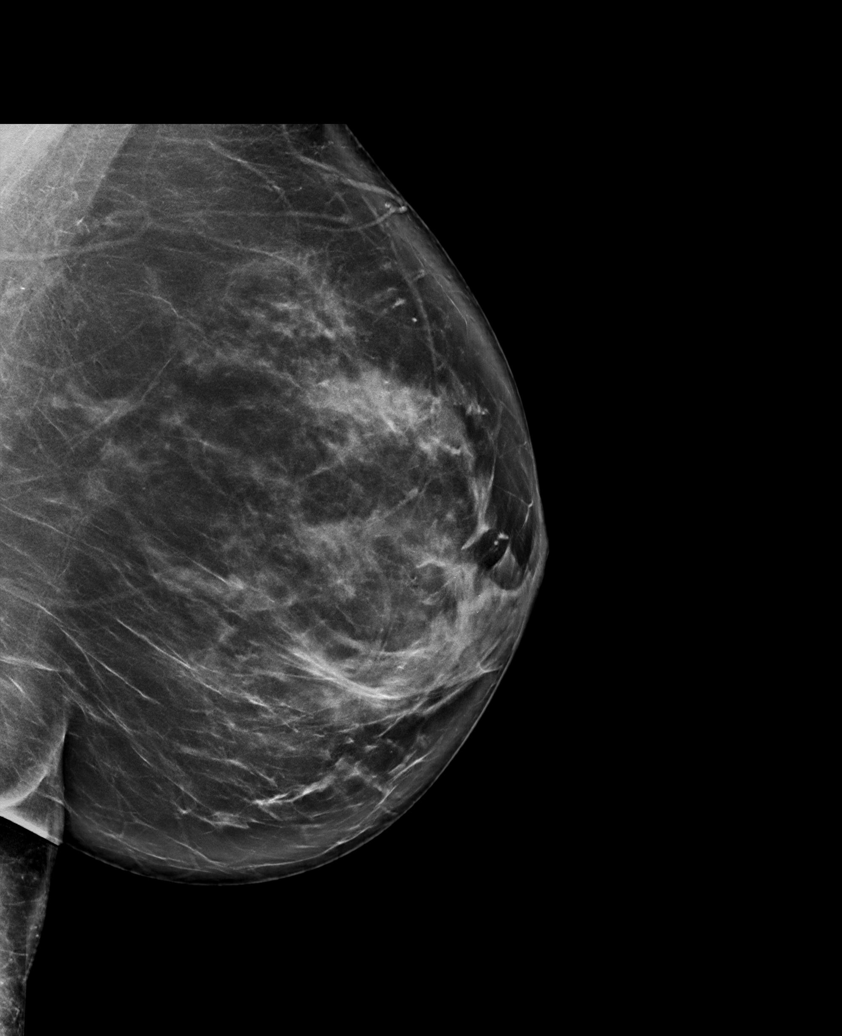

[R MLO synth-2D]
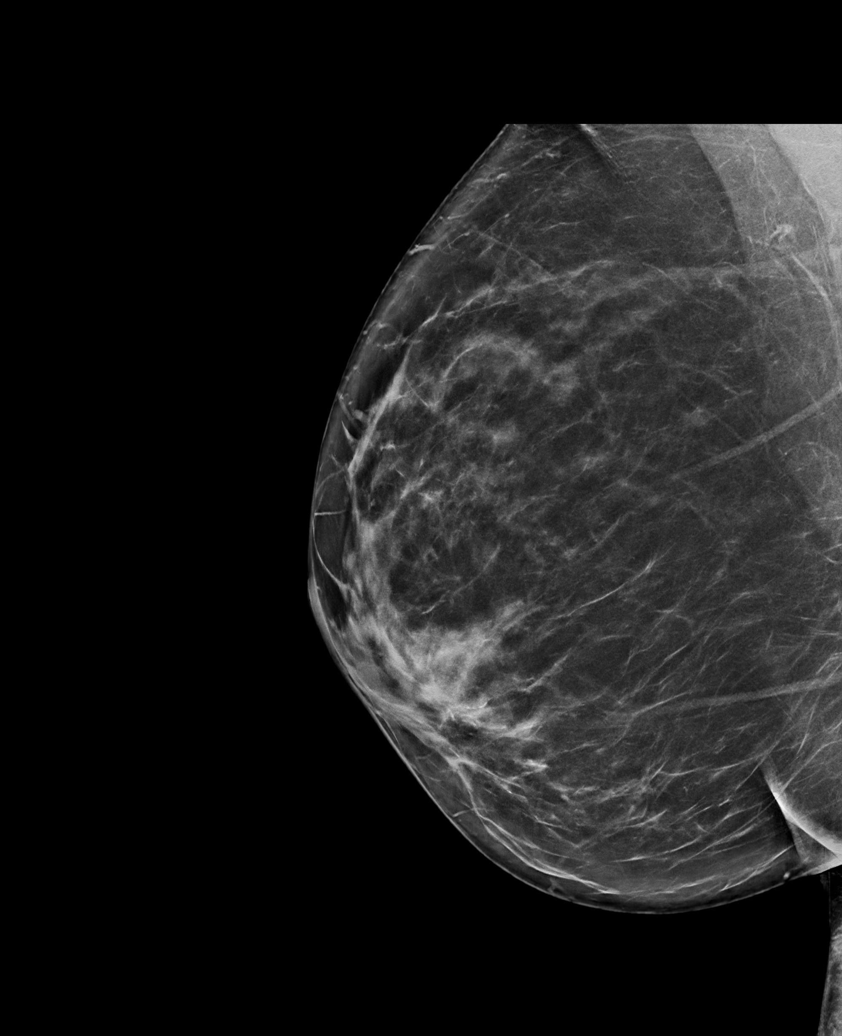

[L CC synth-2D (2 of 2)]
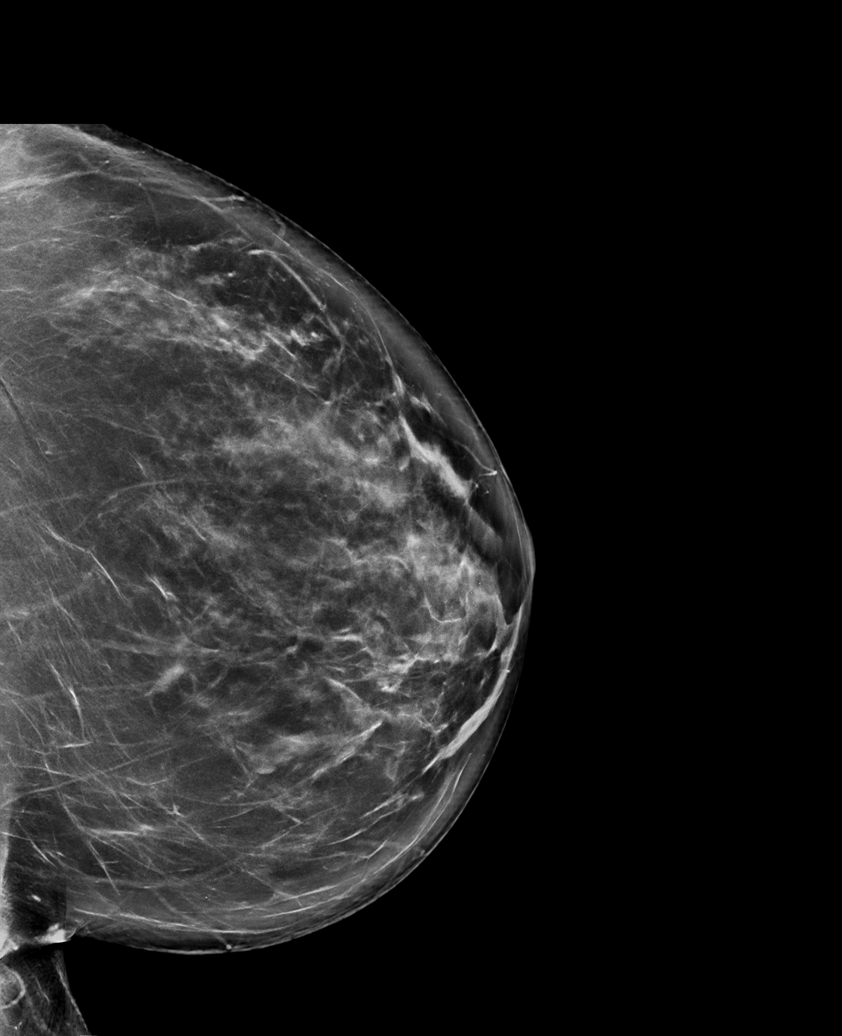

[L CC tomo · tomo slice 47/94.0]
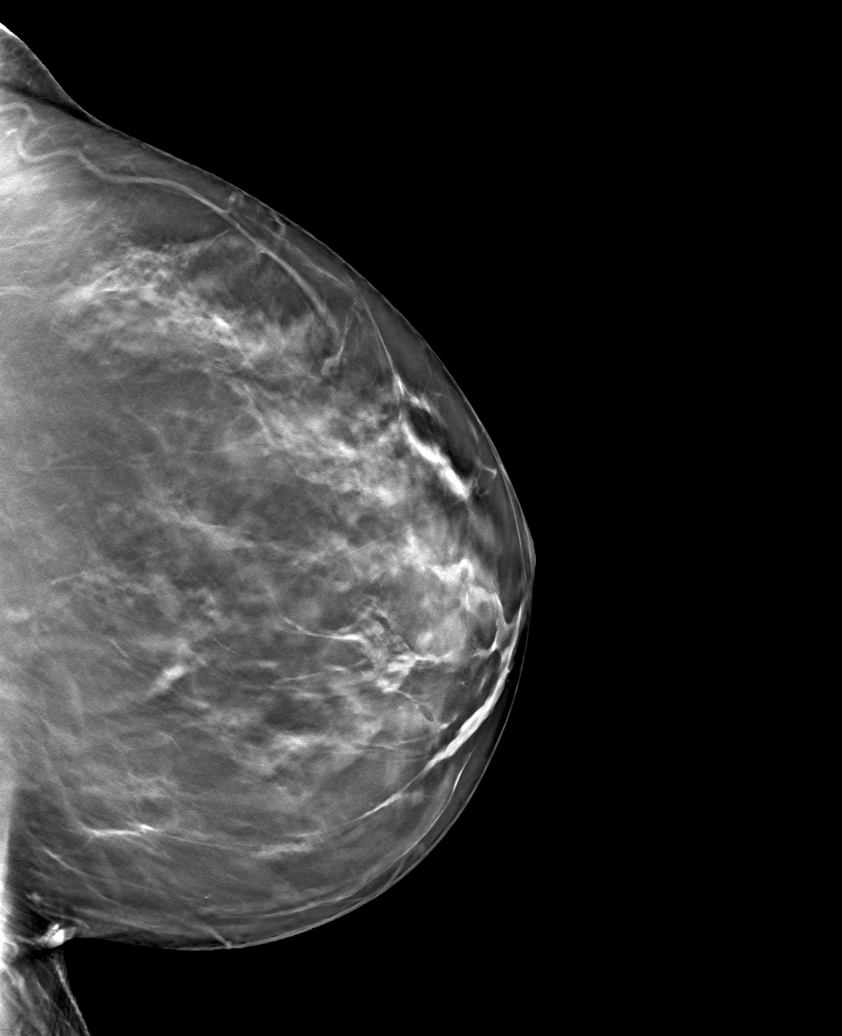

[6 of 30 positions shown; findings below may reference images not displayed]

ACR Breast Density Category c: The breast tissue is heterogeneously
dense, which may obscure small masses.
FINDINGS: There are no findings suspicious for malignancy. Images were
processed with CAD.
IMPRESSION: No mammographic evidence of malignancy. A result letter of this
screening mammogram will be mailed directly to the patient.

RECOMMENDATION:
Screening mammogram in one year. (Code:FT-U-LHB)

BI-RADS CATEGORY  1: Negative.

## 2020-09-17 ENCOUNTER — Encounter: Payer: Self-pay | Admitting: Gastroenterology

## 2020-10-15 ENCOUNTER — Encounter: Payer: Self-pay | Admitting: Gastroenterology

## 2020-11-06 ENCOUNTER — Other Ambulatory Visit: Payer: Self-pay

## 2020-11-06 ENCOUNTER — Ambulatory Visit (AMBULATORY_SURGERY_CENTER): Payer: BC Managed Care – PPO | Admitting: *Deleted

## 2020-11-06 VITALS — Ht 63.5 in | Wt 151.0 lb

## 2020-11-06 DIAGNOSIS — Z1211 Encounter for screening for malignant neoplasm of colon: Secondary | ICD-10-CM

## 2020-11-06 MED ORDER — NA SULFATE-K SULFATE-MG SULF 17.5-3.13-1.6 GM/177ML PO SOLN
ORAL | 0 refills | Status: DC
Start: 2020-11-06 — End: 2020-11-18

## 2020-11-06 NOTE — Progress Notes (Signed)
Patient's pre-visit was done today over the phone with the patient due to COVID-19 pandemic. Name,DOB and address verified. Insurance verified. Patient denies any allergies to Eggs and Soy. Patient denies any problems with anesthesia/sedation. Patient denies taking diet pills or blood thinners. Packet of Prep instructions mailed to patient including a copy of a consent form-pt is aware. suprep Prep coupon included. Patient understands to call us back with any questions or concerns. The patient is COVID-19 fully vaccinated, per patient. Patient is aware of our care-partner policy and FSELT-53 safety protocol. EMMI education assigned to the patient for the procedure, sent to [facility].

## 2020-11-15 ENCOUNTER — Encounter: Payer: Self-pay | Admitting: Gastroenterology

## 2020-11-18 ENCOUNTER — Ambulatory Visit (AMBULATORY_SURGERY_CENTER): Payer: BC Managed Care – PPO | Admitting: Gastroenterology

## 2020-11-18 ENCOUNTER — Other Ambulatory Visit: Payer: Self-pay

## 2020-11-18 ENCOUNTER — Encounter: Payer: Self-pay | Admitting: Gastroenterology

## 2020-11-18 VITALS — BP 119/66 | HR 72 | Temp 96.2°F | Resp 19 | Ht 63.5 in | Wt 151.0 lb

## 2020-11-18 DIAGNOSIS — D123 Benign neoplasm of transverse colon: Secondary | ICD-10-CM

## 2020-11-18 DIAGNOSIS — D120 Benign neoplasm of cecum: Secondary | ICD-10-CM

## 2020-11-18 DIAGNOSIS — Z1211 Encounter for screening for malignant neoplasm of colon: Secondary | ICD-10-CM

## 2020-11-18 MED ORDER — SODIUM CHLORIDE 0.9 % IV SOLN: 500.0000 mL | Freq: Once | INTRAVENOUS | Status: DC

## 2020-11-18 NOTE — Op Note (Signed)
[facility] Patient Name: Joyce Jackson Procedure Date: 11/18/2020 1:33 PM MRN: [id number] Endoscopist: Mauri Pole , MD Age: 50 Referring MD:  Date of Birth: [birth year] Gender: Female Account #: [id number] Procedure:                Colonoscopy Indications:              Screening for colorectal malignant neoplasm Medicines:                Monitored Anesthesia Care Procedure:                Pre-Anesthesia Assessment:                           - Prior to the procedure, a History and Physical                            was performed, and patient medications and                            allergies were reviewed. The patient's tolerance of                            previous anesthesia was also reviewed. The risks                            and benefits of the procedure and the sedation                            options and risks were discussed with the patient.                            All questions were answered, and informed consent                            was obtained. Prior Anticoagulants: The patient has                            taken no previous anticoagulant or antiplatelet                            agents. ASA Grade Assessment: II - A patient with                            mild systemic disease. After reviewing the risks                            and benefits, the patient was deemed in                            satisfactory condition to undergo the procedure.                           After obtaining informed consent, the colonoscope  was passed under direct vision. Throughout the                            procedure, the patient's blood pressure, pulse, and                            oxygen saturations were monitored continuously. The                            Olympus PCF-H190DL (#[id number]) Colonoscope was                            introduced through the anus and advanced to the the                            cecum,  identified by appendiceal orifice and                            ileocecal valve. The colonoscopy was performed                            without difficulty. The patient tolerated the                            procedure well. The quality of the bowel                            preparation was good. The ileocecal valve,                            appendiceal orifice, and rectum were photographed. Scope In: 1:39:46 PM Scope Out: 1:55:49 PM Scope Withdrawal Time: 0 hours 9 minutes 29 seconds  Total Procedure Duration: 0 hours 16 minutes 3 seconds  Findings:                 The perianal and digital rectal examinations were                            normal.                           Two sessile polyps were found in the transverse                            colon and appendiceal orifice. The polyps were 1 to                            2 mm in size. These polyps were removed with a cold                            biopsy forceps. Resection and retrieval were                            complete.  A diffuse area of mild melanosis was found in the                            entire colon.                           Non-bleeding internal hemorrhoids were found during                            retroflexion. The hemorrhoids were medium-sized. Complications:            No immediate complications. Estimated Blood Loss:     Estimated blood loss was minimal. Impression:               - Two 1 to 2 mm polyps in the transverse colon and                            at the appendiceal orifice, removed with a cold                            biopsy forceps. Resected and retrieved.                           - Melanosis in the colon.                           - Non-bleeding internal hemorrhoids. Recommendation:           - Patient has a contact number available for                            emergencies. The signs and symptoms of potential                            delayed complications  were discussed with the                            patient. Return to normal activities tomorrow.                            Written discharge instructions were provided to the                            patient.                           - Resume previous diet.                           - Continue present medications.                           - Await pathology results.                           - Repeat colonoscopy in 5-10 years for surveillance  based on pathology results. Mauri Pole, MD 11/18/2020 2:04:54 PM This report has been signed electronically.

## 2020-11-18 NOTE — Progress Notes (Signed)
Vitals by [place]. Pt's states no medical or surgical changes since previsit or office visit.  

## 2020-11-18 NOTE — Progress Notes (Signed)
A/ox3, pleased with MAC, report to RN 

## 2020-11-18 NOTE — Progress Notes (Signed)
No problems noted in the recovery room. maw 

## 2020-11-18 NOTE — Progress Notes (Signed)
Called to room to assist during endoscopic procedure.  Patient ID and intended procedure confirmed with present staff. Received instructions for my participation in the procedure from the performing physician.  

## 2020-11-18 NOTE — Patient Instructions (Addendum)
Handouts were given to your care partner on polyps and hemorrhoids.  You may resume your current medications today. Await biopsy results.  May take 1-3 weeks to receive pathology results. Please call if any questions or concerns.     YOU HAD AN ENDOSCOPIC PROCEDURE TODAY AT [facility]:   Refer to the procedure report that was given to you for any specific questions about what was found during the examination.  If the procedure report does not answer your questions, please call your gastroenterologist to clarify.  If you requested that your care partner not be given the details of your procedure findings, then the procedure report has been included in a sealed envelope for you to review at your convenience later.  YOU SHOULD EXPECT: Some feelings of bloating in the abdomen. Passage of more gas than usual.  Walking can help get rid of the air that was put into your GI tract during the procedure and reduce the bloating. If you had a lower endoscopy (such as a colonoscopy or flexible sigmoidoscopy) you may notice spotting of blood in your stool or on the toilet paper. If you underwent a bowel prep for your procedure, you may not have a normal bowel movement for a few days.  Please Note:  You might notice some irritation and congestion in your nose or some drainage.  This is from the oxygen used during your procedure.  There is no need for concern and it should clear up in a day or so.  SYMPTOMS TO REPORT IMMEDIATELY:   Following lower endoscopy (colonoscopy or flexible sigmoidoscopy):  Excessive amounts of blood in the stool  Significant tenderness or worsening of abdominal pains  Swelling of the abdomen that is new, acute  Fever of 100F or higher   For urgent or emergent issues, a gastroenterologist can be reached at any hour by calling [phone]. Do not use MyChart messaging for urgent concerns.    DIET:  We do recommend a small meal at first, but then you may  proceed to your regular diet.  Drink plenty of fluids but you should avoid alcoholic beverages for 24 hours.  ACTIVITY:  You should plan to take it easy for the rest of today and you should NOT DRIVE or use heavy machinery until tomorrow (because of the sedation medicines used during the test).    FOLLOW UP: Our staff will call the number listed on your records 48-72 hours following your procedure to check on you and address any questions or concerns that you may have regarding the information given to you following your procedure. If we do not reach you, we will leave a message.  We will attempt to reach you two times.  During this call, we will ask if you have developed any symptoms of COVID 19. If you develop any symptoms (ie: fever, flu-like symptoms, shortness of breath, cough etc.) before then, please call [phone].  If you test positive for Covid 19 in the 2 weeks post procedure, please call and report this information to [place].    If any biopsies were taken you will be contacted by phone or by letter within the next 1-3 weeks.  Please call us at [phone] if you have not heard about the biopsies in 3 weeks.    SIGNATURES/CONFIDENTIALITY: You and/or your care partner have signed paperwork which will be entered into your electronic medical record.  These signatures attest to the fact that that the information above on your After  Visit Summary has been reviewed and is understood.  Full responsibility of the confidentiality of this discharge information lies with you and/or your care-partner. 

## 2020-11-20 ENCOUNTER — Telehealth: Payer: Self-pay

## 2020-11-20 NOTE — Telephone Encounter (Signed)
  Follow up Call-  Call back number 11/18/2020  Post procedure Call Back phone  # [phone]  Permission to leave phone message Yes  Some recent data might be hidden     Patient questions:  Do you have a fever, pain , or abdominal swelling? No. Pain Score  0 *  Have you tolerated food without any problems? Yes.    Have you been able to return to your normal activities? Yes.    Do you have any questions about your discharge instructions: Diet   No. Medications  No. Follow up visit  No.  Do you have questions or concerns about your Care? No.  Actions: * If pain score is 4 or above: No action needed, pain <4.  1. Have you developed a fever since your procedure? No   2.   Have you had an respiratory symptoms (SOB or cough) since your procedure? No   3.   Have you tested positive for COVID 19 since your procedure? No   4.   Have you had any family members/close contacts diagnosed with the COVID 19 since your procedure?  No    If yes to any of these questions please route to Joylene John, RN and Joella Prince, RN

## 2020-12-10 ENCOUNTER — Encounter: Payer: Self-pay | Admitting: Gastroenterology

## 2022-11-06 ENCOUNTER — Encounter: Payer: Self-pay | Admitting: Orthopedic Surgery

## 2022-11-06 ENCOUNTER — Ambulatory Visit (INDEPENDENT_AMBULATORY_CARE_PROVIDER_SITE_OTHER): Payer: PRIVATE HEALTH INSURANCE | Admitting: Orthopedic Surgery

## 2022-11-06 ENCOUNTER — Other Ambulatory Visit (INDEPENDENT_AMBULATORY_CARE_PROVIDER_SITE_OTHER): Payer: PRIVATE HEALTH INSURANCE

## 2022-11-06 VITALS — BP 149/89 | HR 68 | Ht 63.25 in | Wt 156.6 lb

## 2022-11-06 DIAGNOSIS — M545 Low back pain, unspecified: Secondary | ICD-10-CM

## 2022-11-06 NOTE — Progress Notes (Signed)
Orthopedic Spine Surgery Office Note  Assessment: Patient is a 52 y.o. female with lumbar scoliosis. CSVL is in line with middle of T10. No significant coronal or sagittal imbalance   Plan: -Patient has not tried any specific treatment recently  -Recommended core strengthening and occasional Tylenol or ibuprofen use for particularly symptomatic days.  Can continue with light stretching -Patient should return to office in 26 weeks, x-rays at next visit: scoliosis   Patient expressed understanding of the plan and all questions were answered to the patient's satisfaction.   ___________________________________________________________________________   History:  Patient is a 52 y.o. female who presents today for lumbar spine.  Patient has had low back pain for several years now.  Pain is felt on the right side of the lower lumbar spine.  Periodically it radiates down the posterior aspect of the right leg.  The pain is more significant on the right lower back though.  She has a mass in the area of the lumbar paraspinal muscles.  It has been previously biopsied by her PCP and was determined to be muscle.  She saw dermatology for it as well who agreed that it was just a muscle mass.  She has been doing light stretching exercises and feels that that helps.  Denies paresthesias and numbness.   Weakness: Denies Symptoms of imbalance: Denies Paresthesias and numbness: Denies Bowel or bladder incontinence: Denies Saddle anesthesia: Denies  Treatments tried: None  Review of systems: Denies fevers and chills, night sweats, unexplained weight loss, history of cancer, pain that wakes them at night  Past medical history: Depression/anxiety Irritable bowel syndrome  Allergies: codeine  Past surgical history:  Cesarean section  Social history: Denies use of nicotine product (smoking, vaping, patches, smokeless) Alcohol use: Yes, 6 drinks per week Denies recreational drug use   Physical  Exam:  BMI 27.5  General: no acute distress, appears stated age Neurologic: alert, answering questions appropriately, following commands Respiratory: unlabored breathing on room air, symmetric chest rise Psychiatric: appropriate affect, normal cadence to speech   MSK (spine):  -Strength exam      Left  Right EHL    5/5  5/5 TA    5/5  5/5 GSC    5/5  5/5 Knee extension  5/5  5/5 Hip flexion   5/5  5/5  -Sensory exam    Sensation intact to light touch in L3-S1 nerve distributions of bilateral lower extremities  -Achilles DTR: 2/4 on the left, 2/4 on the right -Patellar tendon DTR: 2/4 on the left, 2/4 on the right  -Straight leg raise: Negative bilaterally -Femoral nerve stretch test: Negative bilaterally -Clonus: no beats bilaterally  -Left hip exam: No pain through range of motion, negative Stinchfield, negative FABER, negative SI joint compression test -Right hip exam: No pain through range of motion, negative Stinchfield, negative FABER, negative SI joint compression test  On forward bend test, she has a protuberance over the right lumbar paraspinal muscles.  No tenderness palpation over the mass.  Imaging: XR of the lumbar spine from 11/06/2022 was independently reviewed and interpreted, showing lumbar scoliosis with a Cobb angle of 19 degrees.  Apex to the right at L2. Central sacral vertical line intersects the midportion of T10.  Lumbar lordosis measures 55 degrees.  Disc height loss with early anterior osteophyte formation at L2/3.   Patient name: Joyce Jackson Patient MRN: [id number] Date of visit: 11/06/22

## 2023-05-10 ENCOUNTER — Ambulatory Visit (INDEPENDENT_AMBULATORY_CARE_PROVIDER_SITE_OTHER): Payer: PRIVATE HEALTH INSURANCE | Admitting: Orthopedic Surgery

## 2023-05-10 ENCOUNTER — Other Ambulatory Visit (INDEPENDENT_AMBULATORY_CARE_PROVIDER_SITE_OTHER): Payer: PRIVATE HEALTH INSURANCE

## 2023-05-10 DIAGNOSIS — M545 Low back pain, unspecified: Secondary | ICD-10-CM

## 2023-05-10 NOTE — Progress Notes (Signed)
Orthopedic Spine Surgery Office Note   Assessment: Patient is a 52 y.o. female with lumbar scoliosis. No significant coronal or sagittal imbalance     Plan: -Patient has tried stretching exercises -Recommended core strengthening. Can use up to 1000mg  of Tylenol TID. NSAIDs to be used for flares -Continue with light stretching -Patient should return to office in 6 months, x-rays at next visit: scoliosis   Patient expressed understanding of the plan and all questions were answered to the patient's satisfaction.    ___________________________________________________________________________     History:   Patient is a 52 y.o. female who presents today for follow up on her lumbar spine.  Patient's pain is similar to the last time that I saw her.  She says she mostly notices it when she is lifting or carrying a heavier object.  She gives the example of carrying groceries or work related equipment.  She does not have pain at rest.  She has no pain radiating into either lower extremity.  She admitted that she has not been doing core strengthening since last time I saw her.  No bowel or bladder incontinence.  No saddle anesthesia.     Treatments tried: stretching exercises     Physical Exam:   General: no acute distress, appears stated age Neurologic: alert, answering questions appropriately, following commands Respiratory: unlabored breathing on room air, symmetric chest rise Psychiatric: appropriate affect, normal cadence to speech     MSK (spine):   -Strength exam                                                   Left                  Right EHL                              5/5                  5/5 TA                                 5/5                  5/5 GSC                             5/5                  5/5 Knee extension            5/5                  5/5 Hip flexion                    5/5                  5/5   -Sensory exam                           Sensation intact to  light touch in L3-S1 nerve distributions of bilateral lower extremities   -Achilles DTR: 2/4 on the left, 2/4 on the right -Patellar  tendon DTR: 2/4 on the left, 2/4 on the right   -Straight leg raise: Negative bilaterally -Femoral nerve stretch test: Negative bilaterally -Clonus: no beats bilaterally   -Left hip exam: No pain through range of motion, negative Stinchfield, negative FABER -Right hip exam: No pain through range of motion, negative Stinchfield, negative FABER   Imaging: XR of the lumbar spine from 05/10/2023 was independently reviewed and interpreted, showing lumbar scoliosis with apex to right at L2. Cobb angle of 16 degrees. Lumbar lordosis measures 58 degrees.  SVA of 1.4.  Disc height loss at L2/3. No fracture or dislocation seen.      Patient name: Joyce Jackson Patient MRN: [id number] Date of visit: 05/10/23

## 2023-11-08 ENCOUNTER — Ambulatory Visit: Payer: Self-pay | Admitting: Orthopedic Surgery

## 2023-11-08 ENCOUNTER — Other Ambulatory Visit (INDEPENDENT_AMBULATORY_CARE_PROVIDER_SITE_OTHER): Payer: Self-pay

## 2023-11-08 DIAGNOSIS — M545 Low back pain, unspecified: Secondary | ICD-10-CM | POA: Diagnosis not present

## 2023-11-08 NOTE — Progress Notes (Signed)
 Orthopedic Spine Surgery Office Note   Assessment: Patient is a 53 y.o. female with lumbar scoliosis. No significant coronal or sagittal imbalance     Plan: -No operative plans at this time -Continue with light stretching, aerobic activity, core strengthening -For her neck talked about some medications for radiculopathy, but she is not interested in taking too many medications and was more interested in physical modalities. Talked about inversion table, traction, and PT as options. She wanted to try an inversion table and traction at home -Patient should return to office in 6 months, x-rays at next visit: scoliosis   Patient expressed understanding of the plan and all questions were answered to the patient's satisfaction.    ___________________________________________________________________________     History:   Patient is a 53 y.o. female who presents today for follow up on her lumbar spine.  Patient is still having right-sided low back pain.  She says it similar to when she was last in the office.  She notices worse if she is lifting something particular groceries or her work laptop.  It does get better if she rests.  She has been working on [employer], fashion/clothing.  She does not have any pain radiating to either leg.  She also is reporting neck pain that radiates into the right upper extremity.  She feels a going along the lateral arm into the dorsal forearm and the radial digits.  She says the pain is intermittent in nature.  It is not consistent.  There is no trauma or injury that preceded the onset of pain.  She has no pain radiating into the left upper extremity.  She has not noticed any weakness in her arms.  She has not noticed any trouble with fine motor skills or unsteadiness with gait.   Treatments tried: stretching exercises     Physical Exam:   General: no acute distress, appears stated age Neurologic: alert, answering questions appropriately, following commands Respiratory:  unlabored breathing on room air, symmetric chest rise Psychiatric: appropriate affect, normal cadence to speech     MSK (spine):   -Strength exam                                                   Left                  Right Grip strength                5/5  5/5 Interosseus   5/5   5/5 Wrist extension  5/5  5/5 Wrist flexion   5/5  5/5 Elbow flexion   5/5  5/5 Deltoid    5/5  5/5  EHL    5/5  5/5 TA    5/5  5/5 GSC    5/5  5/5 Knee extension  5/5  5/5 Hip flexion   5/5  5/5   -Sensory exam                 Sensation intact to light touch in C5-T1 nerve distributions of bilateral upper extremities             Sensation intact to light touch in L3-S1 nerve distributions of bilateral lower extremities   -Biceps DTR: 2/4 on the left, 2/4 on the right -Brachial radialis DTR: 2/4 on the left, 2/4 on the right -Achilles DTR: 2/4  on the left, 2/4 on the right -Patellar tendon DTR: 2/4 on the left, 2/4 on the right   -Straight leg raise: Negative bilaterally -Clonus: no beats bilaterally -Negative grip and release test -Negative Hoffmann bilaterally   Imaging: XRs scoli from 11/08/2023 were independently reviewed and interpreted, showing a Cobb angle of 23 degrees with apex to the right at L2.  Less than a millimeter of coronal deviation from CSVL.  SVA of 1.3cm.  Disc height loss at L2/3 with small anterior osteophyte formation. Disc height loss with anterior osteophyte formation at C5/6 and C6/7. No fracture or dislocation seen.      Patient name: Joyce Jackson Patient MRN: [id number] Date of visit: 11/08/23

## 2024-05-10 ENCOUNTER — Other Ambulatory Visit (INDEPENDENT_AMBULATORY_CARE_PROVIDER_SITE_OTHER): Payer: PRIVATE HEALTH INSURANCE

## 2024-05-10 ENCOUNTER — Ambulatory Visit: Payer: PRIVATE HEALTH INSURANCE | Admitting: Orthopedic Surgery

## 2024-05-10 DIAGNOSIS — M545 Low back pain, unspecified: Secondary | ICD-10-CM | POA: Diagnosis not present

## 2024-05-10 NOTE — Progress Notes (Signed)
 Orthopedic Spine Surgery Office Note   Assessment: Patient is a 53 y.o. female with lumbar scoliosis. No significant coronal or sagittal imbalance     Plan: -No operative plans at this time -Continue with light stretching, aerobic activity, core strengthening, maintain the weight loss -Since her curvature has not changed significantly over the last 6 months, we will space out her follow-up -Patient should return to office in 1 year, x-rays at next visit: scoliosis   Patient expressed understanding of the plan and all questions were answered to the patient's satisfaction.    ___________________________________________________________________________     History:   Patient is a 53 y.o. female who presents today for scoliosis follow-up.  She says that she is doing better since she was last seen in the office.  She lost about 30 pounds.  She says that she has noticed improvement in her pain.  She is not having any consistent radiating leg pain.  She comes in today for routine follow-up.  Has not developed any new symptoms since she was last seen in the office.   Treatments tried: stretching exercises, weight loss     Physical Exam:   General: no acute distress, appears stated age Neurologic: alert, answering questions appropriately, following commands Respiratory: unlabored breathing on room air, symmetric chest rise Psychiatric: appropriate affect, normal cadence to speech     MSK (spine):   -Strength exam                                                   Left                  Right   EHL                              5/5                  5/5 TA                                 5/5                  5/5 GSC                             5/5                  5/5 Knee extension            5/5                  5/5 Hip flexion                    5/5                  5/5   -Sensory exam                                       Sensation intact to light touch in C5-T1 nerve distributions  of bilateral upper extremities   Imaging: XRs scoli from 05/10/2024 were independently reviewed and interpreted, showing  a Cobb angle of 27 degrees with apex to the right at L2.  No significant coronal imbalance or deviation from this CSVL.  Disc height loss at C5/6 and C6/7.  In the lumbar spine, disc height loss with small anterior osteophyte formation at L2/3.  No other significant degenerative changes seen.  No fracture or dislocation seen.     Patient name: Joyce Jackson Patient MRN: [id number] Date of visit: 05/10/24

## 2024-06-19 ENCOUNTER — Encounter: Payer: Self-pay | Admitting: Radiology

## 2025-05-10 ENCOUNTER — Ambulatory Visit: Payer: PRIVATE HEALTH INSURANCE | Admitting: Orthopedic Surgery
